# Patient Record
Sex: Male | Born: 1980 | Race: Black or African American | Hispanic: No | Marital: Married | Smoking: Current every day smoker
Health system: Southern US, Community
[De-identification: ages and names within clinical notes are randomized; demographics above are authoritative.]

## PROBLEM LIST (undated history)

## (undated) DIAGNOSIS — F419 Anxiety disorder, unspecified: Secondary | ICD-10-CM

---

## 2006-12-06 ENCOUNTER — Emergency Department (HOSPITAL_COMMUNITY): Admission: EM | Admit: 2006-12-06 | Discharge: 2006-12-06 | Payer: Self-pay | Admitting: Family Medicine

## 2007-11-08 ENCOUNTER — Emergency Department (HOSPITAL_COMMUNITY): Admission: EM | Admit: 2007-11-08 | Discharge: 2007-11-08 | Payer: Self-pay | Admitting: Family Medicine

## 2010-05-06 ENCOUNTER — Emergency Department (HOSPITAL_COMMUNITY): Admission: EM | Admit: 2010-05-06 | Discharge: 2010-05-06 | Payer: Self-pay | Admitting: Emergency Medicine

## 2010-11-22 LAB — DIFFERENTIAL
Eosinophils Absolute: 0.1 10*3/uL (ref 0.0–0.7)
Eosinophils Relative: 3 % (ref 0–5)
Lymphocytes Relative: 30 % (ref 12–46)
Lymphs Abs: 1.4 10*3/uL (ref 0.7–4.0)

## 2010-11-22 LAB — BASIC METABOLIC PANEL
BUN: 6 mg/dL (ref 6–23)
CO2: 25 mEq/L (ref 19–32)
Calcium: 9.4 mg/dL (ref 8.4–10.5)
Chloride: 102 mEq/L (ref 96–112)
Creatinine, Ser: 0.87 mg/dL (ref 0.4–1.5)
GFR calc Af Amer: >60 mL/min (ref >60)
GFR calc non Af Amer: >60 mL/min (ref >60)
Glucose, Bld: 112 mg/dL — ABNORMAL HIGH (ref 70–99)
Potassium: 3 mEq/L — ABNORMAL LOW (ref 3.5–5.1)
Sodium: 138 mEq/L (ref 135–145)

## 2010-11-22 LAB — RAPID URINE DRUG SCREEN, HOSP PERFORMED
Amphetamines: NOT DETECTED
Barbiturates: NOT DETECTED
Benzodiazepines: NOT DETECTED
Cocaine: NOT DETECTED
Opiates: NOT DETECTED
Tetrahydrocannabinol: NOT DETECTED

## 2010-11-22 LAB — CBC
HCT: 46.6 % (ref 39.0–52.0)
Hemoglobin: 16.1 g/dL (ref 13.0–17.0)
MCH: 31.1 pg (ref 26.0–34.0)
MCHC: 34.7 g/dL (ref 30.0–36.0)
Platelets: 172 10*3/uL (ref 150–400)

## 2010-11-22 LAB — URINALYSIS, ROUTINE W REFLEX MICROSCOPIC
Glucose, UA: NEGATIVE mg/dL
Hgb urine dipstick: NEGATIVE
Specific Gravity, Urine: 1.021 (ref 1.005–1.030)
pH: 5.5 (ref 5.0–8.0)

## 2010-11-22 LAB — POCT CARDIAC MARKERS: Myoglobin, poc: >500 ng/mL (ref 12–200)

## 2010-11-22 LAB — ETHANOL: Alcohol, Ethyl (B): <5 mg/dL (ref 0–10)

## 2017-10-08 ENCOUNTER — Ambulatory Visit (HOSPITAL_COMMUNITY): Payer: Self-pay

## 2017-10-26 ENCOUNTER — Ambulatory Visit (HOSPITAL_COMMUNITY): Payer: Self-pay

## 2017-10-28 ENCOUNTER — Ambulatory Visit (HOSPITAL_COMMUNITY): Payer: Self-pay

## 2017-10-30 ENCOUNTER — Ambulatory Visit (HOSPITAL_COMMUNITY): Payer: Self-pay

## 2017-11-02 ENCOUNTER — Ambulatory Visit (HOSPITAL_COMMUNITY): Payer: Self-pay

## 2017-11-04 ENCOUNTER — Ambulatory Visit (HOSPITAL_COMMUNITY): Payer: Self-pay

## 2017-11-06 ENCOUNTER — Ambulatory Visit (HOSPITAL_COMMUNITY): Payer: Self-pay

## 2017-11-09 ENCOUNTER — Ambulatory Visit (HOSPITAL_COMMUNITY): Payer: Self-pay

## 2017-11-11 ENCOUNTER — Ambulatory Visit (HOSPITAL_COMMUNITY): Payer: Self-pay

## 2017-11-13 ENCOUNTER — Ambulatory Visit (HOSPITAL_COMMUNITY): Payer: Self-pay

## 2017-11-16 ENCOUNTER — Ambulatory Visit (HOSPITAL_COMMUNITY): Payer: Self-pay

## 2017-11-18 ENCOUNTER — Ambulatory Visit (HOSPITAL_COMMUNITY): Payer: Self-pay

## 2017-11-20 ENCOUNTER — Ambulatory Visit (HOSPITAL_COMMUNITY): Payer: Self-pay

## 2017-11-23 ENCOUNTER — Ambulatory Visit (HOSPITAL_COMMUNITY): Payer: Self-pay

## 2017-11-25 ENCOUNTER — Ambulatory Visit (HOSPITAL_COMMUNITY): Payer: Self-pay

## 2017-11-27 ENCOUNTER — Ambulatory Visit (HOSPITAL_COMMUNITY): Payer: Self-pay

## 2017-11-30 ENCOUNTER — Ambulatory Visit (HOSPITAL_COMMUNITY): Payer: Self-pay

## 2017-12-02 ENCOUNTER — Ambulatory Visit (HOSPITAL_COMMUNITY): Payer: Self-pay

## 2017-12-04 ENCOUNTER — Ambulatory Visit (HOSPITAL_COMMUNITY): Payer: Self-pay

## 2017-12-07 ENCOUNTER — Ambulatory Visit (HOSPITAL_COMMUNITY): Payer: Self-pay

## 2017-12-09 ENCOUNTER — Ambulatory Visit (HOSPITAL_COMMUNITY): Payer: Self-pay

## 2017-12-11 ENCOUNTER — Ambulatory Visit (HOSPITAL_COMMUNITY): Payer: Self-pay

## 2017-12-14 ENCOUNTER — Ambulatory Visit (HOSPITAL_COMMUNITY): Payer: Self-pay

## 2017-12-16 ENCOUNTER — Ambulatory Visit (HOSPITAL_COMMUNITY): Payer: Self-pay

## 2017-12-18 ENCOUNTER — Ambulatory Visit (HOSPITAL_COMMUNITY): Payer: Self-pay

## 2017-12-21 ENCOUNTER — Ambulatory Visit (HOSPITAL_COMMUNITY): Payer: Self-pay

## 2017-12-23 ENCOUNTER — Emergency Department (HOSPITAL_COMMUNITY): Payer: Self-pay

## 2017-12-23 ENCOUNTER — Ambulatory Visit (HOSPITAL_COMMUNITY): Payer: Self-pay

## 2017-12-23 ENCOUNTER — Emergency Department (HOSPITAL_COMMUNITY)
Admission: EM | Admit: 2017-12-23 | Discharge: 2017-12-23 | Disposition: A | Discharge status: Home / Self Care | Payer: Self-pay | Attending: Emergency Medicine | Admitting: Emergency Medicine

## 2017-12-23 ENCOUNTER — Encounter (HOSPITAL_COMMUNITY): Payer: Self-pay | Admitting: *Deleted

## 2017-12-23 ENCOUNTER — Other Ambulatory Visit: Payer: Self-pay

## 2017-12-23 DIAGNOSIS — L089 Local infection of the skin and subcutaneous tissue, unspecified: Secondary | ICD-10-CM

## 2017-12-23 DIAGNOSIS — F17200 Nicotine dependence, unspecified, uncomplicated: Secondary | ICD-10-CM | POA: Insufficient documentation

## 2017-12-23 DIAGNOSIS — L02611 Cutaneous abscess of right foot: Secondary | ICD-10-CM | POA: Insufficient documentation

## 2017-12-23 LAB — BASIC METABOLIC PANEL
Anion gap: 9 (ref 5–15)
BUN: 13 mg/dL (ref 6–20)
CALCIUM: 9.1 mg/dL (ref 8.9–10.3)
CHLORIDE: 104 mmol/L (ref 101–111)
CO2: 26 mmol/L (ref 22–32)
CREATININE: 0.79 mg/dL (ref 0.61–1.24)
GFR calc non Af Amer: >60 mL/min (ref >60)
GLUCOSE: 98 mg/dL (ref 65–99)
Potassium: 4.1 mmol/L (ref 3.5–5.1)
Sodium: 139 mmol/L (ref 135–145)

## 2017-12-23 LAB — CBC
HCT: 46.2 % (ref 39.0–52.0)
Hemoglobin: 16.1 g/dL (ref 13.0–17.0)
MCH: 30.6 pg (ref 26.0–34.0)
MCHC: 34.8 g/dL (ref 30.0–36.0)
MCV: 87.7 fL (ref 78.0–100.0)
PLATELETS: 193 10*3/uL (ref 150–400)
RBC: 5.27 MIL/uL (ref 4.22–5.81)
RDW: 13.3 % (ref 11.5–15.5)
WBC: 4.4 10*3/uL (ref 4.0–10.5)

## 2017-12-23 MED ORDER — CIPROFLOXACIN IN D5W 400 MG/200ML IV SOLN
400.0000 mg | Freq: Once | INTRAVENOUS | Status: AC
  Administered 2017-12-23: 400 mg via INTRAVENOUS
  Filled 2017-12-23: qty 200

## 2017-12-23 MED ORDER — DOXYCYCLINE HYCLATE 100 MG PO CAPS: 100.0000 mg | ORAL_CAPSULE | Freq: Two times a day (BID) | ORAL | 0 refills | Status: DC

## 2017-12-23 MED ORDER — CIPROFLOXACIN HCL 500 MG PO TABS: 500.0000 mg | ORAL_TABLET | Freq: Two times a day (BID) | ORAL | 0 refills | Status: AC

## 2017-12-23 MED ORDER — VANCOMYCIN HCL 10 G IV SOLR
2000.0000 mg | INTRAVENOUS | Status: AC
  Administered 2017-12-23: 2000 mg via INTRAVENOUS
  Filled 2017-12-23: qty 2000

## 2017-12-23 MED ORDER — GADOBENATE DIMEGLUMINE 529 MG/ML IV SOLN
20.0000 mL | Freq: Once | INTRAVENOUS | Status: AC | PRN
  Administered 2017-12-23: 20 mL via INTRAVENOUS

## 2017-12-23 NOTE — ED Notes (Signed)
Patient transported to MRI 

## 2017-12-23 NOTE — ED Notes (Signed)
ED Provider at bedside. 

## 2017-12-23 NOTE — Progress Notes (Signed)
A consult was received from an ED physician for Cipro & Vancomycin per pharmacy dosing for wound infection.  The patient's profile has been reviewed for ht/wt/allergies/indication/available labs.   A one time order has been placed for Cipro 400mg  IV and Vancomycin 2gm IV.  Further antibiotics/pharmacy consults should be ordered by admitting physician if indicated.                       Thank you, Maryellen PilePoindexter, Tanzie Rothschild Trefz, PharmD 12/23/2017  12:12 PM

## 2017-12-23 NOTE — ED Provider Notes (Signed)
[facility]-EMERGENCY DEPT Provider Note   CSN: [id number] Arrival date & time: 12/23/17  0947     History   Chief Complaint Chief Complaint  Patient presents with  . Generalized Body Aches  . Leg Pain    Rt  . Foot Pain    Rt    HPI Jonathan Ortiz is a 37 y.o. male presenting for evaluation of right foot pain, generalized body aches, and right thigh pain.  Pt states that 2 days ago, he developed all of his symptoms at once.  He had generalized body aches and fevers.  He noticed a bump on the bottom of his right foot, which has continued to be tender.  He is got pain of the anterior right thigh, near his groin.  He denies fall, trauma, or injury.  He denies sick contacts.  He has been taking naproxen with improvement of his symptoms.  He is afebrile today.  He has no other medical problems, does not take medications daily. He denies stepping on anything. No h/o DM or neuropathy.   HPI  History reviewed. No pertinent past medical history.  There are no active problems to display for this patient.   History reviewed. No pertinent surgical history.      Home Medications    Prior to Admission medications   Medication Sig Start Date End Date Taking? Authorizing Provider  Multiple Vitamin (MULTIVITAMIN WITH MINERALS) TABS tablet Take 2 tablets by mouth daily.   Yes [provider]  naproxen (NAPROSYN) 500 MG tablet Take 500 mg by mouth 2 (two) times daily as needed for mild pain.   Yes [provider]  ciprofloxacin (CIPRO) 500 MG tablet Take 1 tablet (500 mg total) by mouth every 12 (twelve) hours for 5 days. 12/23/17 12/28/17  Erlean Mealor, PA-C  doxycycline (VIBRAMYCIN) 100 MG capsule Take 1 capsule (100 mg total) by mouth 2 (two) times daily. 12/23/17   Eivan Gallina, PA-C    Family History No family history on file.  Social History Social History   Tobacco Use  . Smoking status: Current Every Day Smoker  . Smokeless  tobacco: Never Used  Substance Use Topics  . Alcohol use: Never    Frequency: Never  . Drug use: Never     Allergies   Gadolinium derivatives   Review of Systems Review of Systems  Constitutional: Positive for fever.  Musculoskeletal: Positive for arthralgias and myalgias.       R foot pain, R thigh/groin pain  All other systems reviewed and are negative.    Physical Exam Updated Vital Signs BP 126/88   Pulse 75   Temp 98.4 F (36.9 C) (Oral)   Resp 16   Ht 6\' 3"  (1.905 m)   Wt 103 kg (227 lb)   SpO2 99%   BMI 28.37 kg/m   Physical Exam  Constitutional: He is oriented to person, place, and time. He appears well-developed and well-nourished. No distress.  HENT:  Head: Normocephalic and atraumatic.  Right Ear: Tympanic membrane, external ear and ear canal normal.  Left Ear: Tympanic membrane, external ear and ear canal normal.  Nose: Nose normal.  Mouth/Throat: Uvula is midline, oropharynx is clear and moist and mucous membranes are normal.  Eyes: Pupils are equal, round, and reactive to light. Conjunctivae and EOM are normal.  Neck: Normal range of motion.  Cardiovascular: Normal rate, regular rhythm and intact distal pulses.  Pulmonary/Chest: Effort normal and breath sounds normal. No respiratory distress. He has no  wheezes.  Abdominal: Soft. He exhibits no distension and no mass. There is no tenderness. There is no guarding.  Musculoskeletal: Normal range of motion. He exhibits tenderness.  TTP of anterior R pelvis. No palpable LAD. No rash, deformity, or erythema.  Fluid filled blister on the plantar R foot. No active drainage. Streaking noted on the medial R foot.   Neurological: He is alert and oriented to person, place, and time.  Skin: Skin is warm. No rash noted.  Psychiatric: He has a normal mood and affect.  Nursing note and vitals reviewed.    ED Treatments / Results  Labs (all labs ordered are listed, but only abnormal results are displayed) Labs  Reviewed  AEROBIC CULTURE (SUPERFICIAL SPECIMEN)  CBC  BASIC METABOLIC PANEL    EKG None  Radiology Mr Foot Right W Wo Contrast  Result Date: 12/23/2017 CLINICAL DATA:  The patient reports a lump on the plantar surface of the right foot for 2 days. EXAM: MRI OF THE RIGHT FOREFOOT WITHOUT AND WITH CONTRAST TECHNIQUE: Multiplanar, multisequence MR imaging of the right forefoot was performed before and after the administration of intravenous contrast. CONTRAST:  20 ml MULTIHANCE GADOBENATE DIMEGLUMINE 529 MG/ML IV SOLN COMPARISON:  Plain films right foot 12/23/2017. FINDINGS: Bones/Joint/Cartilage Normal marrow signal throughout without fracture, stress change or focal lesion. No evidence of arthropathy or infectious or inflammatory process. Ligaments Intact and normal in appearance. Muscles and Tendons Intact and normal in appearance. Soft tissues There is ill-defined subcutaneous edema and postcontrast enhancement on the plantar surface of the foot about the plantar fascia which is most notable in the subcutaneous fatty tissues. A small focal tear in the plantar fascia is seen at the level of the proximal metaphysis of the second metatarsal as demonstrated on images 22-24 of series 5. No mass or focal fluid collection is identified. IMPRESSION: Small focal rupture in the plantar fascia at the level the proximal metaphysis of the second metatarsal with surrounding edema and enhancement consistent with inflammatory change. The examination is otherwise negative. Electronically Signed   By: Drusilla Kannerhomas  Dalessio M.D.   On: 12/23/2017 15:41   Dg Foot Complete Right  Result Date: 12/23/2017 CLINICAL DATA:  Pain of the right foot.  Lump. EXAM: RIGHT FOOT COMPLETE - 3+ VIEW COMPARISON:  None. FINDINGS: Mild hallux valgus deformity of the great toe. No evidence of fracture. Suspicion of talocalcaneal coalition. Anterior talar beak. IMPRESSION: No acute finding. Mild hallux valgus deformity of the great toe.  Suspicion of tarsal coalition. Electronically Signed   By: Paulina FusiMark  Shogry M.D.   On: 12/23/2017 11:47    Procedures Procedures (including critical care time)  Medications Ordered in ED Medications  ciprofloxacin (CIPRO) IVPB 400 mg (0 mg Intravenous Stopped 12/23/17 1441)  vancomycin (VANCOCIN) 2,000 mg in sodium chloride 0.9 % 500 mL IVPB (0 mg Intravenous Paused 12/23/17 1458)  gadobenate dimeglumine (MULTIHANCE) injection 20 mL (20 mLs Intravenous Contrast Given 12/23/17 1512)     Initial Impression / Assessment and Plan / ED Course  I have reviewed the triage vital signs and the nursing notes.  Pertinent labs & imaging results that were available during my care of the patient were reviewed by me and considered in my medical decision making (see chart for details).     Patient presenting for evaluation of generalized body aches, right foot pain, right anterior thigh pain.  Physical exam shows patient is afebrile not tachycardic.  He appears nontoxic.  With attending, Dr. Patria Maneampos evaluated patient.  On  exam, blister started draining purulent material.  Concerned due to possible infection with streaking of the foot.  Left anterior thigh pain likely due to lymphadenopathy.  Obtain x-ray to ensure no ostium myelitis, subcu gas, fracture, or obvious foreign body.  X-ray reviewed and interpreted by me, negative for the above.  Basic labs reassuring, no leukocytosis.  Will obtain MRI of foot to rule out deep space infection, start IV antibiotics.  MRI shows superficial infection without deep space infection.  Discussed findings with patient.  Discussed discharge with antibiotics and follow-up as needed.  At this time, patient appears safe for discharge.  Return precautions given.  Patient states he understands and agrees to plan.   Final Clinical Impressions(s) / ED Diagnoses   Final diagnoses:  Foot infection    ED Discharge Orders        Ordered    doxycycline (VIBRAMYCIN) 100 MG capsule   2 times daily     12/23/17 1557    ciprofloxacin (CIPRO) 500 MG tablet  Every 12 hours     12/23/17 1557       Alveria Apley, PA-C 12/23/17 1559    Azalia Bilis, MD 12/23/17 1654

## 2017-12-23 NOTE — ED Triage Notes (Signed)
Pt states for 2 days, "feeling bad and body aches" " rt foot lump and pin in upper rt thigh" "I wasn't gona come but wife and mom told me to" states he feels better today

## 2017-12-23 NOTE — ED Notes (Signed)
Patient transported to X-ray 

## 2017-12-23 NOTE — Discharge Instructions (Addendum)
Take antibiotics as prescribed.  Take the entire course, even if your symptoms improve. Continue to use Tylenol or ibuprofen as needed for pain. Return to the emergency room if you develop fevers, worsening redness coming up your leg, worsening infection, or any new or concerning symptoms.

## 2017-12-25 ENCOUNTER — Ambulatory Visit (HOSPITAL_COMMUNITY): Payer: Self-pay

## 2017-12-25 LAB — AEROBIC CULTURE  (SUPERFICIAL SPECIMEN): SPECIAL REQUESTS: NORMAL

## 2017-12-25 LAB — AEROBIC CULTURE W GRAM STAIN (SUPERFICIAL SPECIMEN)

## 2017-12-28 ENCOUNTER — Ambulatory Visit (HOSPITAL_COMMUNITY): Payer: Self-pay

## 2017-12-30 ENCOUNTER — Ambulatory Visit (HOSPITAL_COMMUNITY): Payer: Self-pay

## 2018-01-01 ENCOUNTER — Ambulatory Visit (HOSPITAL_COMMUNITY): Payer: Self-pay

## 2018-01-04 ENCOUNTER — Ambulatory Visit (HOSPITAL_COMMUNITY): Payer: Self-pay

## 2018-01-06 ENCOUNTER — Ambulatory Visit (HOSPITAL_COMMUNITY): Payer: Self-pay

## 2018-01-08 ENCOUNTER — Ambulatory Visit (HOSPITAL_COMMUNITY): Payer: Self-pay

## 2018-01-11 ENCOUNTER — Ambulatory Visit (HOSPITAL_COMMUNITY): Payer: Self-pay

## 2018-01-13 ENCOUNTER — Ambulatory Visit (HOSPITAL_COMMUNITY): Payer: Self-pay

## 2018-01-15 ENCOUNTER — Ambulatory Visit (HOSPITAL_COMMUNITY): Payer: Self-pay

## 2018-01-18 ENCOUNTER — Ambulatory Visit (HOSPITAL_COMMUNITY): Payer: Self-pay

## 2018-01-20 ENCOUNTER — Ambulatory Visit (HOSPITAL_COMMUNITY): Payer: Self-pay

## 2018-01-22 ENCOUNTER — Ambulatory Visit (HOSPITAL_COMMUNITY): Payer: Self-pay

## 2018-01-25 ENCOUNTER — Ambulatory Visit (HOSPITAL_COMMUNITY): Payer: Self-pay

## 2018-01-27 ENCOUNTER — Ambulatory Visit (HOSPITAL_COMMUNITY): Payer: Self-pay

## 2018-02-18 ENCOUNTER — Encounter (HOSPITAL_COMMUNITY): Payer: Self-pay | Admitting: Emergency Medicine

## 2018-02-18 ENCOUNTER — Emergency Department (HOSPITAL_COMMUNITY): Payer: Self-pay

## 2018-02-18 ENCOUNTER — Other Ambulatory Visit: Payer: Self-pay

## 2018-02-18 ENCOUNTER — Emergency Department (HOSPITAL_COMMUNITY)
Admission: EM | Admit: 2018-02-18 | Discharge: 2018-02-18 | Discharge status: Against Medical Advice | Payer: Self-pay | Attending: Emergency Medicine | Admitting: Emergency Medicine

## 2018-02-18 DIAGNOSIS — Z79899 Other long term (current) drug therapy: Secondary | ICD-10-CM | POA: Insufficient documentation

## 2018-02-18 DIAGNOSIS — F17210 Nicotine dependence, cigarettes, uncomplicated: Secondary | ICD-10-CM | POA: Insufficient documentation

## 2018-02-18 DIAGNOSIS — R9431 Abnormal electrocardiogram [ECG] [EKG]: Secondary | ICD-10-CM | POA: Insufficient documentation

## 2018-02-18 DIAGNOSIS — R079 Chest pain, unspecified: Secondary | ICD-10-CM | POA: Insufficient documentation

## 2018-02-18 HISTORY — DX: Anxiety disorder, unspecified: F41.9

## 2018-02-18 LAB — CBC
HCT: 47.6 % (ref 39.0–52.0)
Hemoglobin: 15.9 g/dL (ref 13.0–17.0)
MCH: 29.6 pg (ref 26.0–34.0)
MCHC: 33.4 g/dL (ref 30.0–36.0)
MCV: 88.6 fL (ref 78.0–100.0)
PLATELETS: 192 10*3/uL (ref 150–400)
RBC: 5.37 MIL/uL (ref 4.22–5.81)
RDW: 13.6 % (ref 11.5–15.5)
WBC: 3.6 10*3/uL — AB (ref 4.0–10.5)

## 2018-02-18 LAB — BASIC METABOLIC PANEL
Anion gap: 10 (ref 5–15)
BUN: 8 mg/dL (ref 6–20)
CHLORIDE: 104 mmol/L (ref 101–111)
CO2: 25 mmol/L (ref 22–32)
CREATININE: 0.88 mg/dL (ref 0.61–1.24)
Calcium: 9.2 mg/dL (ref 8.9–10.3)
GFR calc Af Amer: >60 mL/min (ref >60)
GFR calc non Af Amer: >60 mL/min (ref >60)
GLUCOSE: 113 mg/dL — AB (ref 65–99)
Potassium: 3.5 mmol/L (ref 3.5–5.1)
SODIUM: 139 mmol/L (ref 135–145)

## 2018-02-18 LAB — I-STAT TROPONIN, ED: Troponin i, poc: 0 ng/mL (ref 0.00–0.08)

## 2018-02-18 NOTE — ED Triage Notes (Signed)
Pt arrives via EMS for 30 minutes of left sided chest pain that started during video games. Also reports dizziness. Evaluated by the PA at triage.

## 2018-02-18 NOTE — ED Notes (Signed)
Patient in room. Refusing to change into gown.

## 2018-02-18 NOTE — ED Provider Notes (Addendum)
Patient placed in Quick Look pathway, seen and evaluated   Chief Complaint: chest pain, shortness of breath  HPI:   Patient presents today for evaluation of sudden onset, constant left-sided chest pain and shortness of breath. Pain began approximately 30 minutes prior to arrival while he was playing a video game. Pain is localized to the left lower chest, radiates to the right lower chest. Chest pain worsens with deep inspiration and bending. Endorses some nausea.No recent trauma or falls. He is a current smoker. He did have 4 alcoholic beverages yesterday and smoked some CBD but denies any other recreational drug use. No recent travel or surgeries, no lower extremity edema, no hemoptysis, no prior history of DVT or PE, and he is not on testosterone hormone replacement therapy. Denies fevers, chills, vomiting, or diaphoresis.  ROS: positive for chest pain, shortness of breath, nausea negative for fevers, chills, syncope, abdominal pain, vomiting  Physical Exam:   Gen: No distress  Neuro: Awake and Alert  Skin: Warm    Focused Exam:heart regular rate and rhythm, no murmurs rubs or gallops. Lungs clear to auscultation bilaterally.speaking in full sentences without difficulty.  Palpation of the right lower anterior chest wall causes pain in the left lower anterior chest wall. No deformity, crepitus, ecchymosis, or for segment noted. 2+ DP/PT pulses and 2+ radial pulses bilaterally. No LE edema.   Initiation of care has begun. The patient has been counseled on the process, plan, and necessity for staying for the completion/evaluation, and the remainder of the medical screening examination      Bennye AlmFawze, Tattiana Fakhouri A, PA-C 02/18/18 1706    Margarita Grizzleay, Danielle, MD 02/18/18 2123

## 2018-02-18 NOTE — ED Notes (Signed)
Patient arrived to room, refused to change into gown and stated that he wanted to leave. EDP was notified. Dr. Rosalia Hammersay came in to discuss with patient need to collect troponin to rule cardiac concern. Patient came out to desk requesting that IV be taken out. Patient left against medical advice.

## 2018-02-18 NOTE — ED Notes (Signed)
Delay in lab draw,  Pt not in room 

## 2018-02-18 NOTE — ED Provider Notes (Signed)
[facility] EMERGENCY DEPARTMENT Provider Note   CSN: [id number] Arrival date & time: 02/18/18  1649     History   Chief Complaint Chief Complaint  Patient presents with  . Chest Pain    HPI Jonathan Ortiz is a 37 y.o. male.  HPI  37 year old male presents today complaining of left lower chest pain sudden onset that began while playing video games earlier today.  He states that lasted approximately half an hour.  It resolved in route to the hospital via EMS after receiving aspirin.  He states it feels similar to pain he had previously with anxiety.  Past Medical History:  Diagnosis Date  . Anxiety     There are no active problems to display for this patient.   No past surgical history on file.      Home Medications    Prior to Admission medications   Medication Sig Start Date End Date Taking? Authorizing Provider  doxycycline (VIBRAMYCIN) 100 MG capsule Take 1 capsule (100 mg total) by mouth 2 (two) times daily. 12/23/17   Caccavale, Sophia, PA-C  Multiple Vitamin (MULTIVITAMIN WITH MINERALS) TABS tablet Take 2 tablets by mouth daily.    [provider]  naproxen (NAPROSYN) 500 MG tablet Take 500 mg by mouth 2 (two) times daily as needed for mild pain.    [provider]    Family History No family history on file.  Social History Social History   Tobacco Use  . Smoking status: Current Every Day Smoker  . Smokeless tobacco: Never Used  Substance Use Topics  . Alcohol use: Never    Frequency: Never  . Drug use: Never     Allergies   Gadolinium derivatives   Review of Systems Review of Systems  All other systems reviewed and are negative.    Physical Exam Updated Vital Signs BP 114/89   Pulse 68   Temp 98.6 F (37 C)   Resp 16   SpO2 100%   Physical Exam  Constitutional: He is oriented to person, place, and time. He appears well-developed and well-nourished.  Obese  HENT:  Head: Normocephalic and  atraumatic.  Right Ear: External ear normal.  Left Ear: External ear normal.  Nose: Nose normal.  Mouth/Throat: Oropharynx is clear and moist.  Eyes: Pupils are equal, round, and reactive to light. Conjunctivae and EOM are normal.  Neck: Normal range of motion. Neck supple.  Cardiovascular: Normal rate, regular rhythm, normal heart sounds and intact distal pulses.  Pulmonary/Chest: Effort normal and breath sounds normal. No respiratory distress. He has no wheezes. He exhibits no tenderness.  Abdominal: Soft. Bowel sounds are normal. He exhibits no distension and no mass. There is no tenderness. There is no guarding.  Musculoskeletal: Normal range of motion.  Neurological: He is alert and oriented to person, place, and time. He has normal reflexes. He exhibits normal muscle tone. Coordination normal.  Skin: Skin is warm and dry.  Psychiatric: He has a normal mood and affect. His behavior is normal. Judgment and thought content normal.  Nursing note and vitals reviewed.    ED Treatments / Results  Labs (all labs ordered are listed, but only abnormal results are displayed) Labs Reviewed  BASIC METABOLIC PANEL - Abnormal; Notable for the following components:      Result Value   Glucose, Bld 113 (*)    All other components within normal limits  CBC - Abnormal; Notable for the following components:   WBC 3.6 (*)  All other components within normal limits  I-STAT TROPONIN, ED  I-STAT TROPONIN, ED  I-STAT TROPONIN, ED    EKG EKG Interpretation  Date/Time:  Thursday February 18 2018 16:52:56 EDT Ventricular Rate:  83 PR Interval:  166 QRS Duration: 86 QT Interval:  360 QTC Calculation: 423 R Axis:   77 Text Interpretation:  Normal sinus rhythm Poor R wave progression Non-specific ST-t changes Confirmed by Margarita Grizzleay, Ketra Duchesne [phone]([id number]) on 02/18/2018 8:58:00 PM   Radiology Dg Chest 2 View  Result Date: 02/18/2018 CLINICAL DATA:  Chest pain, shortness of breath, tightness EXAM: CHEST - 2  VIEW COMPARISON:  05/06/2010 FINDINGS: The heart size and mediastinal contours are within normal limits. Both lungs are clear. The visualized skeletal structures are unremarkable. IMPRESSION: No active cardiopulmonary disease. Electronically Signed   By: Elige KoHetal  Patel   On: 02/18/2018 17:29    Procedures Procedures (including critical care time)  Medications Ordered in ED Medications - No data to display   Initial Impression / Assessment and Plan / ED Course  I have reviewed the triage vital signs and the nursing notes.  Pertinent labs & imaging results that were available during my care of the patient were reviewed by me and considered in my medical decision making (see chart for details).     37 year old male presents today with left-sided chest pain.  EKG with poor R wave progression compared to prior.  Initial troponin negative.  Plan repeat troponin.  Heart score 2.  Discussed with patient.  Patient left after my evaluation without discussing with me.  Final Clinical Impressions(s) / ED Diagnoses   Final diagnoses:  Chest pain, unspecified type  Abnormal EKG    ED Discharge Orders    None       Margarita Grizzleay, Kolby Schara, MD 02/18/18 2122

## 2018-02-19 ENCOUNTER — Emergency Department (HOSPITAL_COMMUNITY)
Admission: EM | Admit: 2018-02-19 | Discharge: 2018-02-19 | Disposition: A | Discharge status: Home / Self Care | Payer: Self-pay | Attending: Emergency Medicine | Admitting: Emergency Medicine

## 2018-02-19 ENCOUNTER — Encounter (HOSPITAL_COMMUNITY): Payer: Self-pay | Admitting: *Deleted

## 2018-02-19 DIAGNOSIS — F419 Anxiety disorder, unspecified: Secondary | ICD-10-CM | POA: Insufficient documentation

## 2018-02-19 DIAGNOSIS — R079 Chest pain, unspecified: Secondary | ICD-10-CM | POA: Insufficient documentation

## 2018-02-19 DIAGNOSIS — F17200 Nicotine dependence, unspecified, uncomplicated: Secondary | ICD-10-CM | POA: Insufficient documentation

## 2018-02-19 LAB — I-STAT TROPONIN, ED: TROPONIN I, POC: 0 ng/mL (ref 0.00–0.08)

## 2018-02-19 MED ORDER — LORAZEPAM 1 MG PO TABS: 1.0000 mg | ORAL_TABLET | Freq: Four times a day (QID) | ORAL | 0 refills | Status: DC | PRN

## 2018-02-19 NOTE — ED Provider Notes (Signed)
[facility] EMERGENCY DEPARTMENT Provider Note   CSN: [id number] Arrival date & time: 02/19/18  0031     History   Chief Complaint Chief Complaint  Patient presents with  . Chest Pain    HPI Rosser Collington is a 37 y.o. male.  Patient is a 37 year old male with no significant past medical history.  He presents today with complaints of chest discomfort.  This started earlier this evening while he was at home playing a video game.  He reports a tightness in the center of his chest with no radiation, nausea, diaphoresis, or shortness of breath.  He was seen here earlier for this complaint.  He had an EKG and laboratory studies obtained which were negative.  He was advised to stay for a second troponin, however left AGAINST MEDICAL ADVICE.  His pain has persisted once arriving home and returns to have a second troponin performed.  The history is provided by the patient.  Chest Pain   This is a new problem. The current episode started 6 to 12 hours ago. The problem occurs constantly. The problem has been resolved. The pain is present in the substernal region. The pain is moderate. The quality of the pain is described as pressure-like. The pain does not radiate. Pertinent negatives include no cough, no diaphoresis, no numbness, no palpitations and no shortness of breath. He has tried nothing for the symptoms. The treatment provided no relief.    Past Medical History:  Diagnosis Date  . Anxiety     There are no active problems to display for this patient.   History reviewed. No pertinent surgical history.      Home Medications    Prior to Admission medications   Medication Sig Start Date End Date Taking? Authorizing Provider  simethicone (MYLICON) 80 MG chewable tablet Chew 80 mg by mouth every 6 (six) hours as needed for flatulence.   Yes [provider]  doxycycline (VIBRAMYCIN) 100 MG capsule Take 1 capsule (100 mg total) by mouth 2 (two) times  daily. Patient not taking: Reported on 02/19/2018 12/23/17   Caccavale, Jeanette Caprice, PA-C    Family History No family history on file.  Social History Social History   Tobacco Use  . Smoking status: Current Every Day Smoker  . Smokeless tobacco: Never Used  Substance Use Topics  . Alcohol use: Never    Frequency: Never  . Drug use: Never     Allergies   Gadolinium derivatives   Review of Systems Review of Systems  Constitutional: Negative for diaphoresis.  Respiratory: Negative for cough and shortness of breath.   Cardiovascular: Positive for chest pain. Negative for palpitations.  Neurological: Negative for numbness.  All other systems reviewed and are negative.    Physical Exam Updated Vital Signs BP 133/81   Pulse 69   Temp 98.3 F (36.8 C) (Oral)   Resp 18   SpO2 98%   Physical Exam  Constitutional: He is oriented to person, place, and time. He appears well-developed and well-nourished. No distress.  HENT:  Head: Normocephalic and atraumatic.  Mouth/Throat: Oropharynx is clear and moist.  Neck: Normal range of motion. Neck supple.  Cardiovascular: Normal rate and regular rhythm. Exam reveals no friction rub.  No murmur heard. Pulmonary/Chest: Effort normal and breath sounds normal. No respiratory distress. He has no wheezes. He has no rales.  Abdominal: Soft. Bowel sounds are normal. He exhibits no distension. There is no tenderness.  Musculoskeletal: Normal range of motion. He exhibits no  edema.       Right lower leg: Normal. He exhibits no tenderness and no edema.       Left lower leg: Normal. He exhibits no tenderness and no edema.  Neurological: He is alert and oriented to person, place, and time. Coordination normal.  Skin: Skin is warm and dry. He is not diaphoretic.  Nursing note and vitals reviewed.    ED Treatments / Results  Labs (all labs ordered are listed, but only abnormal results are displayed) Labs Reviewed  I-STAT TROPONIN, ED     EKG EKG Interpretation  Date/Time:  Friday February 19 2018 00:39:11 EDT Ventricular Rate:  77 PR Interval:  176 QRS Duration: 76 QT Interval:  362 QTC Calculation: 409 R Axis:   73 Text Interpretation:  Normal sinus rhythm Septal infarct , age undetermined Abnormal ECG When compared with ECG of 02/18/2018, No significant change was found Confirmed by Dione BoozeGlick, David ([id number]) on 02/19/2018 12:40:57 AM   Radiology Dg Chest 2 View  Result Date: 02/18/2018 CLINICAL DATA:  Chest pain, shortness of breath, tightness EXAM: CHEST - 2 VIEW COMPARISON:  05/06/2010 FINDINGS: The heart size and mediastinal contours are within normal limits. Both lungs are clear. The visualized skeletal structures are unremarkable. IMPRESSION: No active cardiopulmonary disease. Electronically Signed   By: Elige KoHetal  Patel   On: 02/18/2018 17:29    Procedures Procedures (including critical care time)  Medications Ordered in ED Medications - No data to display   Initial Impression / Assessment and Plan / ED Course  I have reviewed the triage vital signs and the nursing notes.  Pertinent labs & imaging results that were available during my care of the patient were reviewed by me and considered in my medical decision making (see chart for details).  Patient's troponin this evening is negative and his EKG is unchanged from prior study.  I highly doubt a cardiac etiology to his symptoms, and suspect more anxiety or costochondritis.  He has only risk factors of tobacco use and his symptoms are atypical.  He will be discharged with ibuprofen and a small quantity of lorazepam as he states he has had this in the past.  Final Clinical Impressions(s) / ED Diagnoses   Final diagnoses:  None    ED Discharge Orders    None       Geoffery Lyonselo, [place], MD 02/19/18 [phone]

## 2018-02-19 NOTE — Discharge Instructions (Signed)
Ibuprofen 600 mill grams every 6 hours as needed for pain.  Ativan as prescribed as needed for anxiety.  Follow-up with cardiology if your symptoms continue beyond the next 3 to 4 days, and return to the ER if symptoms significantly worsen or change.

## 2018-02-19 NOTE — ED Triage Notes (Signed)
Pt was here earlier tonight for chest discomfort, sob, and dizziness. Reports the MD earlier requested pt wait to have a repeat troponin but he was feeling better and decided to leave. Pt returns after having constant chest discomfort with belching to attempt to help the discomfort. Repeat troponin collected in troponin. Labs and chest xray are listed in chart review

## 2019-04-10 IMAGING — MR MR FOOT*R* WO/W CM
5 of 9 series · 19 of 40 positions shown · IV contrast (multihance)
Comparison: Plain films right foot 12/23/2017.

CLINICAL DATA: The patient reports a lump on the plantar surface of
the right foot for 2 days.

EXAM:
MRI OF THE RIGHT FOREFOOT WITHOUT AND WITH CONTRAST
TECHNIQUE: Multiplanar, multisequence MR imaging of the right forefoot was
performed before and after the administration of intravenous
contrast.
CONTRAST:  20 ml MULTIHANCE GADOBENATE DIMEGLUMINE 529 MG/ML IV SOLN

[Series 3: T1 · coronal · 4.0mm · 0.29mm/px · 5 of 40 slices shown (1 of 2)]
[im 1/40]
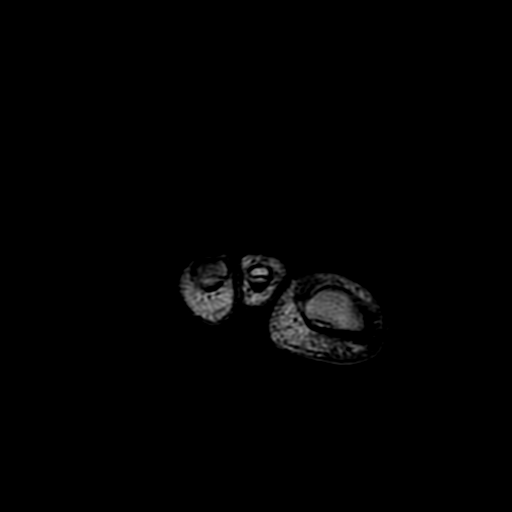
[im 10/40]
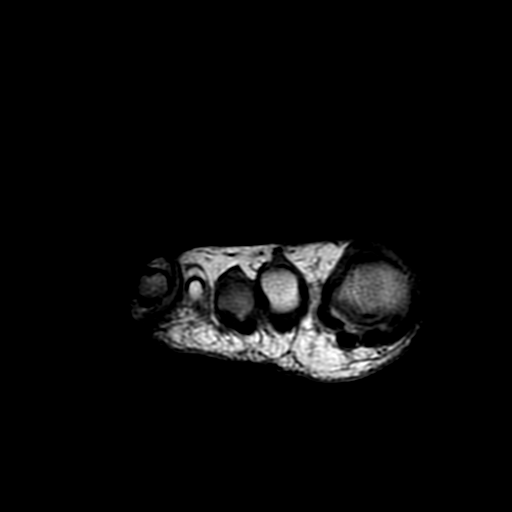
[im 20/40]
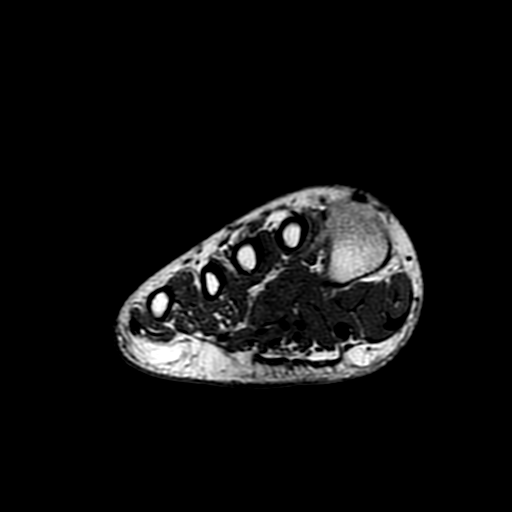
[im 30/40]
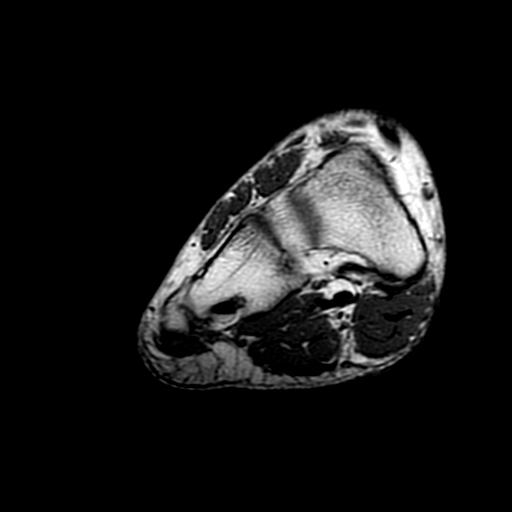
[im 40/40]
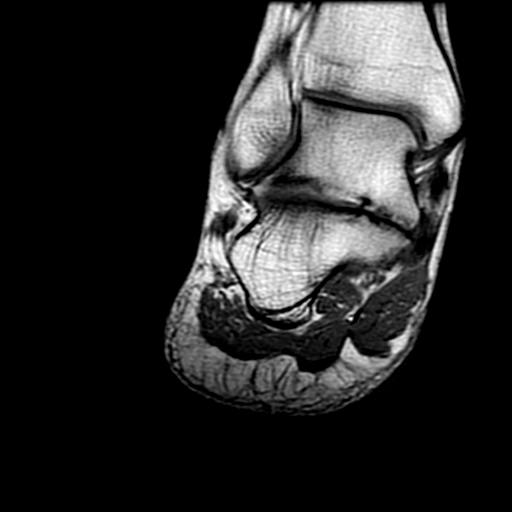

[Series 6: T1 · axial · 4.0mm · 0.35mm/px · z∈[-84,+6]mm · 3 of 20 slices shown (2 of 2)]
[im 1/20]
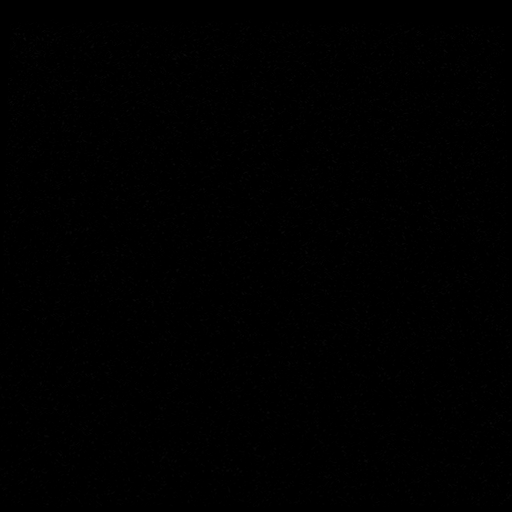
[im 10/20]
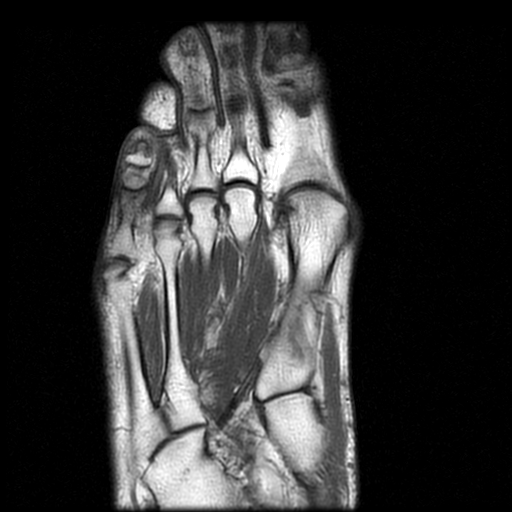
[im 20/20]
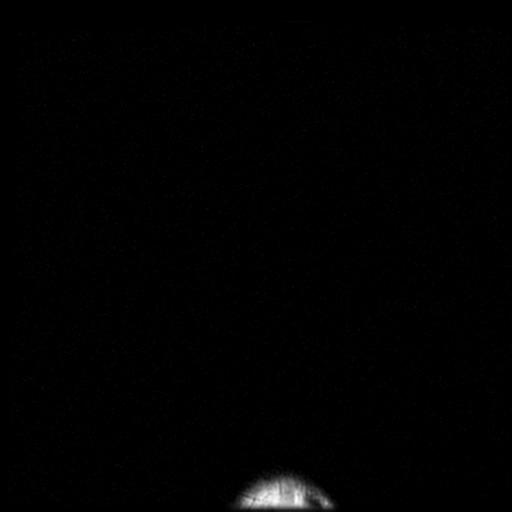

[Series 10: T1 fat-sat post-contrast · coronal · 4.0mm · 0.29mm/px · 6 of 40 slices shown]
[im 1/40]
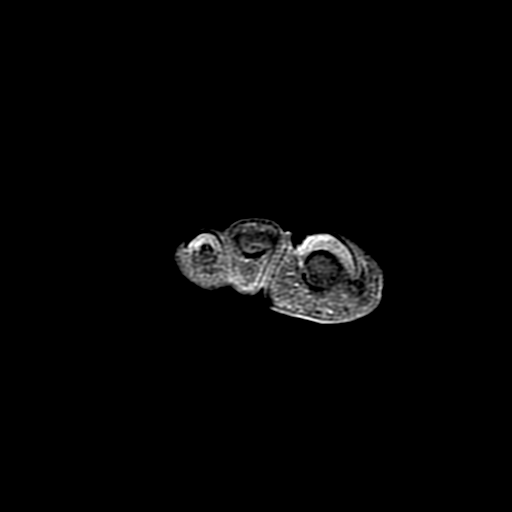
[im 8/40]
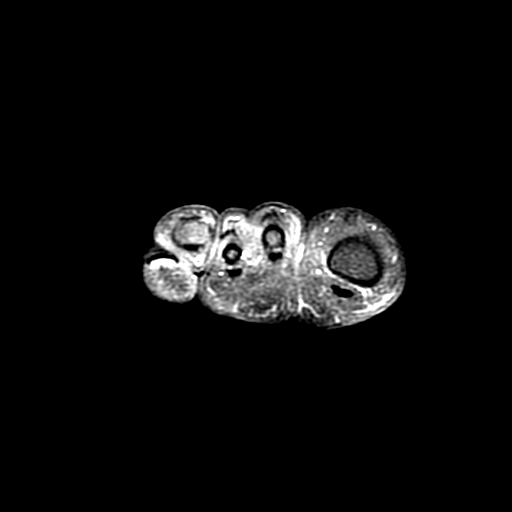
[im 16/40]
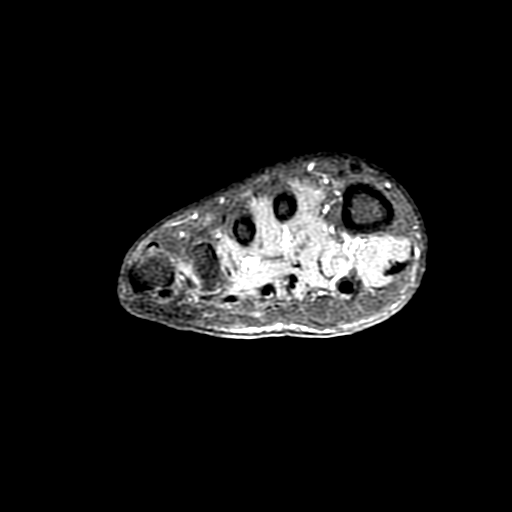
[im 24/40]
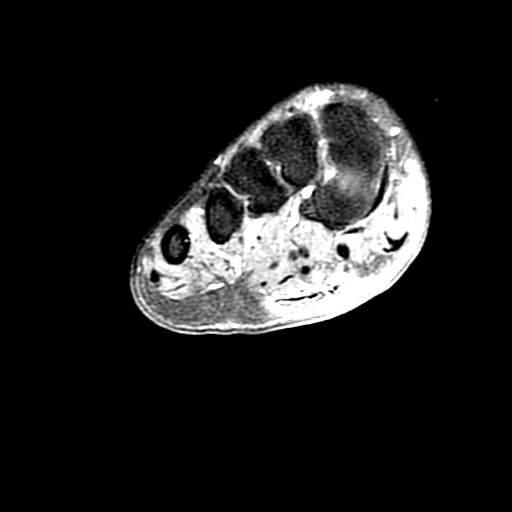
[im 32/40]
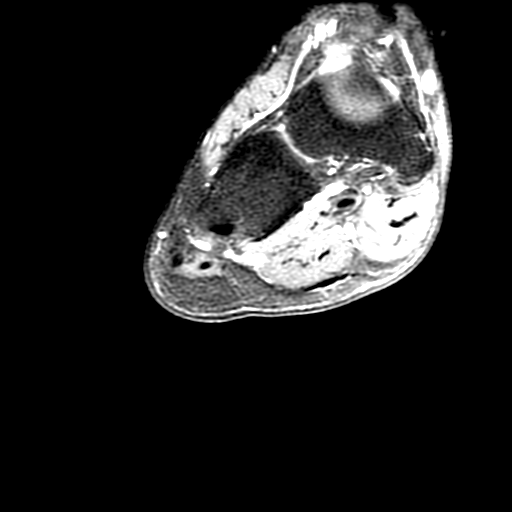
[im 40/40]
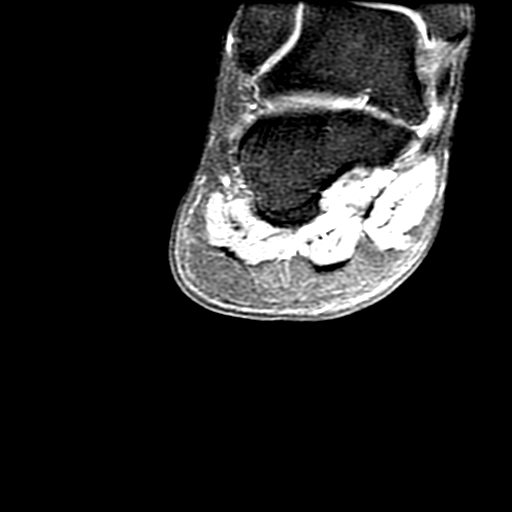

[Series 11: T1 post-contrast · axial · 4.0mm · 0.35mm/px · z∈[-84,+6]mm · 3 of 20 slices shown (1 of 2)]
[im 1/20]
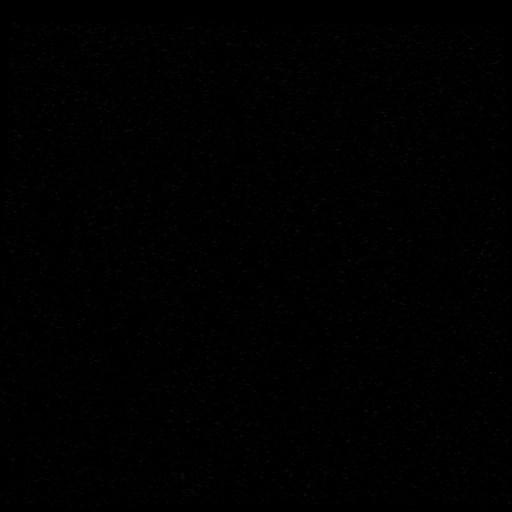
[im 10/20]
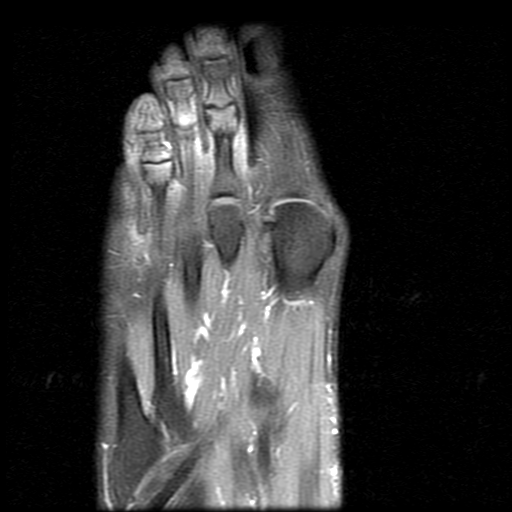
[im 20/20]
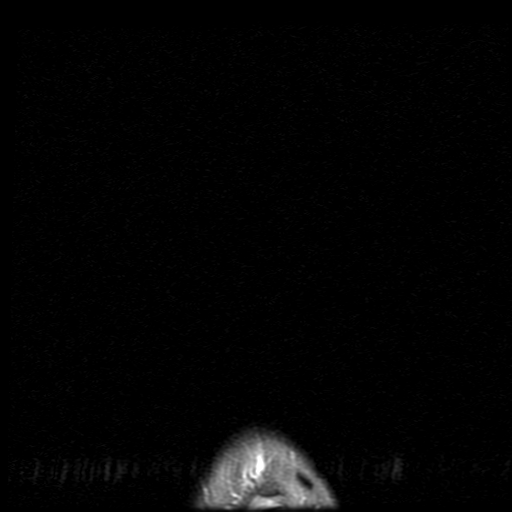

[Series 12: T1 post-contrast · sagittal · 4.0mm · 0.35mm/px · 2 of 24 slices shown (2 of 2)]
[im 1/24]
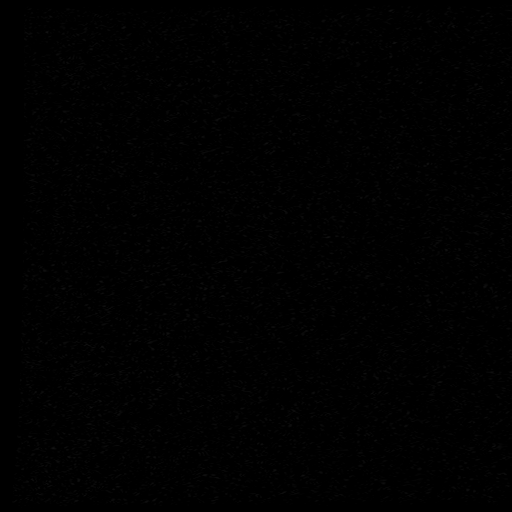
[im 8/24]
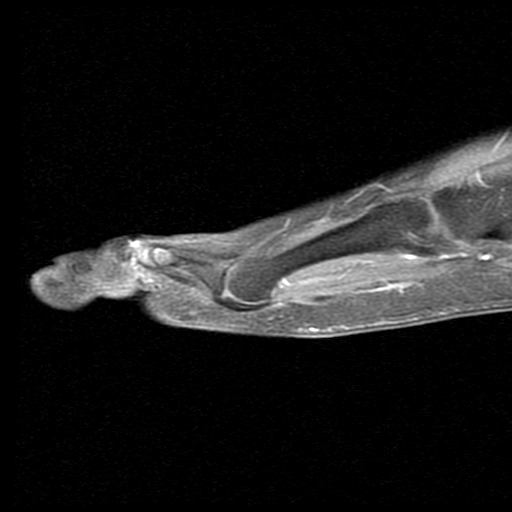

[19 of 40 positions shown; findings below may reference images not displayed]

FINDINGS: Bones/Joint/Cartilage

Normal marrow signal throughout without fracture, stress change or
focal lesion. No evidence of arthropathy or infectious or
inflammatory process.

Ligaments

Intact and normal in appearance.

Muscles and Tendons

Intact and normal in appearance.

Soft tissues

There is ill-defined subcutaneous edema and postcontrast enhancement
on the plantar surface of the foot about the plantar fascia which is
most notable in the subcutaneous fatty tissues. A small focal tear
in the plantar fascia is seen at the level of the proximal
metaphysis of the second metatarsal as demonstrated on images 22-24
of series 5. No mass or focal fluid collection is identified.
IMPRESSION: Small focal rupture in the plantar fascia at the level the proximal
metaphysis of the second metatarsal with surrounding edema and
enhancement consistent with inflammatory change. The examination is
otherwise negative.

## 2019-12-26 ENCOUNTER — Other Ambulatory Visit: Payer: Self-pay

## 2019-12-26 ENCOUNTER — Encounter (HOSPITAL_COMMUNITY): Payer: Self-pay

## 2019-12-26 DIAGNOSIS — G5602 Carpal tunnel syndrome, left upper limb: Secondary | ICD-10-CM | POA: Insufficient documentation

## 2019-12-26 DIAGNOSIS — X501XXA Overexertion from prolonged static or awkward postures, initial encounter: Secondary | ICD-10-CM | POA: Insufficient documentation

## 2019-12-26 DIAGNOSIS — F17200 Nicotine dependence, unspecified, uncomplicated: Secondary | ICD-10-CM | POA: Insufficient documentation

## 2019-12-26 NOTE — ED Triage Notes (Signed)
Pt sts left arm numbness in hand shooting up to elbow for at least 2 weeks. Pt began googling sx and became anxious.

## 2019-12-27 ENCOUNTER — Emergency Department (HOSPITAL_COMMUNITY)
Admission: EM | Admit: 2019-12-27 | Discharge: 2019-12-27 | Disposition: A | Discharge status: Home / Self Care | Payer: Self-pay | Attending: Emergency Medicine | Admitting: Emergency Medicine

## 2019-12-27 DIAGNOSIS — G5602 Carpal tunnel syndrome, left upper limb: Secondary | ICD-10-CM

## 2019-12-27 NOTE — Discharge Instructions (Addendum)
Thank you for allowing me to care for you today in the Emergency Department.   Wear the brace on your wrist, particularly when you are driving and at night, to help with your symptoms.  Take 650 mg of Tylenol or 600 mg of ibuprofen with food every 6 hours for pain.  You can alternate between these 2 medications every 3 hours if your pain returns.  For instance, you can take Tylenol at noon, followed by a dose of ibuprofen at 3, followed by second dose of Tylenol and 6.  You can also apply ice packs for 15 to 20 minutes as frequently as needed to help with symptoms.  Follow-up with neurology if your symptoms do not improve after several weeks despite the regimen above.  Return to the emergency department if you start having a new numbness in other areas, if the numbness becomes persistent, if you have any weakness, begin regularly dropping things at your hands, develop slurred speech, feeling off balance and falling, facial droop, or other new, concerning symptoms.

## 2019-12-27 NOTE — ED Notes (Signed)
Pt given water 

## 2019-12-27 NOTE — ED Notes (Signed)
Pt ambulated to BR with steady gait.

## 2019-12-27 NOTE — ED Provider Notes (Signed)
[facility] DEPT Provider Note   CSN: [id number] Arrival date & time: 12/26/19  2229     History Chief Complaint  Patient presents with  . Arm Numbness    Frank Novelo is a 39 y.o. male with a history of anxiety who presents to the emergency department with a chief complaint of numbness and tingling in the left arm.  The patient's been having intermittent numbness and tingling with an achy pain in the left hand and wrist that will intermittently radiate up the arm.  He thinks that symptoms are worse in the second, third, and fourth digit, but he is unsure.  He came in tonight after the symptoms awoke him from sleep.  He is unsure how long the symptoms last when they start.  No known aggravating or alleviating factors.  He tried taking ibuprofen previously for symptoms without improvement.  No weakness, decreased grip strength, slurred speech, facial droop, numbness or paresthesias in other parts of the body, dizziness, ataxia, neck pain, recent injuries to the left arm, swelling or redness, fever, or chills.  He reports that he became worried after googling his symptoms since they have been coming and going for 2 weeks, and came to the ER for further evaluation.  The patient is a long-distance Administrator.  Reports that he typically drives with his left hand on the wheel for 8 to 9 hours at a time.  He reports that he did drive to [place] yesterday.  No recent left arm injuries or surgeries.   The history is provided by the patient. No language interpreter was used.       Past Medical History:  Diagnosis Date  . Anxiety     There are no problems to display for this patient.   History reviewed. No pertinent surgical history.     No family history on file.  Social History   Tobacco Use  . Smoking status: Current Every Day Smoker  . Smokeless tobacco: Never Used  Substance Use Topics  . Alcohol use: Never  . Drug use: Never    Home  Medications Prior to Admission medications   Medication Sig Start Date End Date Taking? Authorizing Provider  simethicone (MYLICON) 80 MG chewable tablet Chew 80 mg by mouth every 6 (six) hours as needed for flatulence.   Yes [provider]    Allergies    Gadolinium derivatives  Review of Systems   Review of Systems  Constitutional: Negative for appetite change and fever.  Respiratory: Negative for shortness of breath.   Cardiovascular: Negative for chest pain.  Gastrointestinal: Negative for abdominal pain.  Genitourinary: Negative for dysuria.  Musculoskeletal: Negative for back pain, myalgias, neck pain and neck stiffness.  Skin: Negative for rash.  Allergic/Immunologic: Negative for immunocompromised state.  Neurological: Positive for numbness. Negative for dizziness, weakness and headaches.       Paresthesias  Psychiatric/Behavioral: Negative for confusion.    Physical Exam Updated Vital Signs BP (!) 143/83 (BP Location: Left Arm)   Pulse 72   Temp 98.4 F (36.9 C) (Oral)   Resp 17   Ht 6\' 3"  (1.905 m)   Wt 102.1 kg   SpO2 98%   BMI 28.12 kg/m   Physical Exam Vitals and nursing note reviewed.  Constitutional:      Appearance: He is well-developed.  HENT:     Head: Normocephalic.  Eyes:     Conjunctiva/sclera: Conjunctivae normal.  Cardiovascular:     Rate and Rhythm: Normal rate  and regular rhythm.     Heart sounds: No murmur.  Pulmonary:     Effort: Pulmonary effort is normal.  Abdominal:     General: There is no distension.     Palpations: Abdomen is soft.  Musculoskeletal:     Cervical back: Neck supple.     Comments: Full active and passive range of motion of the left wrist, elbow, and shoulder.  Radial pulses are 2+ and symmetric.  Sensation is intact throughout the digits of the left hand, left hand, and left upper arm.  About a 5 strength against resistance of all muscle groups in the bilateral upper extremities.  There is now erythema,  warmth, edema, induration, rash, or fullness to muscular compartments.  Negative Phalen test.  Skin:    General: Skin is warm and dry.  Neurological:     Mental Status: He is alert.  Psychiatric:        Behavior: Behavior normal.     ED Results / Procedures / Treatments   Labs (all labs ordered are listed, but only abnormal results are displayed) Labs Reviewed - No data to display  EKG None  Radiology No results found.  Procedures Procedures (including critical care time)  Medications Ordered in ED Medications - No data to display  ED Course  I have reviewed the triage vital signs and the nursing notes.  Pertinent labs & imaging results that were available during my care of the patient were reviewed by me and considered in my medical decision making (see chart for details).    MDM Rules/Calculators/A&P                      39 year old male with no significant past medical history presenting with intermittent numbness and paresthesias in the left upper arm, predominantly around the left wrist and hand, and forearm for the last 2 weeks.  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  Symptoms seem to be worse at night, he awoke with tingling in the left hand, which prompted his ER visit tonight.  Vital signs are normal.  Symptoms do not seem consistent with cervical radiculopathy.  Doubt upper extremity DVT.  Doubt CVA.  He is having no other neurologic deficits.  Very low suspicion for central neurologic process.  On exam, he is neurovascularly intact throughout.  Given intermittent nature and repetitive use of the left hand and wrist, question carpal tunnel syndrome.  Will place the patient in a wrist splint for comfort and recommended rice therapy.  He will be given a referral if symptoms persist.  He is hemodynamically stable and in no acute distress.  Safe for discharge home with outpatient follow-up as  indicated.  Final Clinical Impression(s) / ED Diagnoses Final diagnoses:  Carpal tunnel syndrome, left    Rx / DC Orders ED Discharge Orders    None       Barkley Boards, PA-C 12/27/19 1002    Rancour, Jeannett Senior, MD 12/27/19 1621

## 2023-11-12 ENCOUNTER — Encounter (HOSPITAL_BASED_OUTPATIENT_CLINIC_OR_DEPARTMENT_OTHER): Payer: Self-pay | Admitting: Emergency Medicine

## 2023-11-12 ENCOUNTER — Other Ambulatory Visit: Payer: Self-pay

## 2023-11-12 ENCOUNTER — Emergency Department (HOSPITAL_BASED_OUTPATIENT_CLINIC_OR_DEPARTMENT_OTHER)
Admission: EM | Admit: 2023-11-12 | Discharge: 2023-11-12 | Disposition: A | Discharge status: Home / Self Care | Payer: Worker's Compensation | Attending: Emergency Medicine | Admitting: Emergency Medicine

## 2023-11-12 DIAGNOSIS — S7011XA Contusion of right thigh, initial encounter: Secondary | ICD-10-CM | POA: Insufficient documentation

## 2023-11-12 DIAGNOSIS — W260XXA Contact with knife, initial encounter: Secondary | ICD-10-CM | POA: Insufficient documentation

## 2023-11-12 DIAGNOSIS — S161XXA Strain of muscle, fascia and tendon at neck level, initial encounter: Secondary | ICD-10-CM | POA: Diagnosis not present

## 2023-11-12 DIAGNOSIS — S5001XA Contusion of right elbow, initial encounter: Secondary | ICD-10-CM | POA: Insufficient documentation

## 2023-11-12 DIAGNOSIS — S199XXA Unspecified injury of neck, initial encounter: Secondary | ICD-10-CM | POA: Diagnosis present

## 2023-11-12 MED ORDER — METHOCARBAMOL 500 MG PO TABS: 500.0000 mg | ORAL_TABLET | Freq: Two times a day (BID) | ORAL | 0 refills | Status: AC

## 2023-11-12 NOTE — ED Triage Notes (Signed)
 Pt reports mechanical fall yesterday. Pt c/o LT side neck pain, LT knee pain, RT proximal LE pain. RT elbow pain.

## 2023-11-12 NOTE — Discharge Instructions (Addendum)
 Please use Tylenol or ibuprofen for pain.  You may use 600 mg ibuprofen every 6 hours or 1000 mg of Tylenol every 6 hours.  You may choose to alternate between the 2.  This would be most effective.  Not to exceed 4 g of Tylenol within 24 hours.  Not to exceed 3200 mg ibuprofen 24 hours.  You can use the muscle relaxant I am prescribing in addition to the above to help with any breakthrough pain.  You can take it up to twice daily.  It is safe to take at night, but I would be cautious taking it during the day as it can cause some drowsiness.  Make sure that you are feeling awake and alert before you get behind the wheel of a car or operate a motor vehicle.  It is not a narcotic pain medication so you are able to take it if it is not making you drowsy and still pilot a vehicle or machinery safely.  You can contact the orthopedic surgeon whose contact information I provided for follow-up if your pain is not significantly improved after 1 to 2 weeks of treatment as above.

## 2023-11-12 NOTE — ED Provider Notes (Signed)
 [place] EMERGENCY DEPARTMENT AT [facility] Provider Note   CSN: [id number] Arrival date & time: 11/12/23  4098     History  Chief Complaint  Patient presents with   Marletta Lor    Jonathan Ortiz is a 43 y.o. male with a past medical history significant for anxiety who presents with concern for left-sided neck pain, right knee pain, right elbow pain after mechanical, nonsyncopal fall yesterday.  Patient reports that he slipped between the cracks on a loading dock and caught himself hard on the right thigh.  He reports that he had a pocket knife in his pocket and feels pain where he pressed into his leg.  He reports that he has been able to walk without difficulty, took nothing for pain prior to arrival, he rates his pain 5/10.  Reports some neck pain with bending the neck.  Denies any numbness, tingling throughout.  He does not take any blood thinners, he did not hit his head.   Fall       Home Medications Prior to Admission medications   Medication Sig Start Date End Date Taking? Authorizing Provider  simethicone (MYLICON) 80 MG chewable tablet Chew 80 mg by mouth every 6 (six) hours as needed for flatulence.    [provider]      Allergies    Gadolinium derivatives    Review of Systems   Review of Systems  All other systems reviewed and are negative.   Physical Exam Updated Vital Signs BP 130/83   Pulse 79   Temp 98.6 F (37 C)   Resp 18   Wt 104.3 kg   SpO2 98%   BMI 28.75 kg/m  Physical Exam Vitals and nursing note reviewed.  Constitutional:      General: He is not in acute distress.    Appearance: Normal appearance.  HENT:     Head: Normocephalic and atraumatic.  Eyes:     General:        Right eye: No discharge.        Left eye: No discharge.  Cardiovascular:     Rate and Rhythm: Normal rate and regular rhythm.     Pulses: Normal pulses.     Heart sounds: No murmur heard.    No friction rub. No gallop.  Pulmonary:     Effort:  Pulmonary effort is normal.     Breath sounds: Normal breath sounds.  Abdominal:     General: Bowel sounds are normal.     Palpations: Abdomen is soft.  Musculoskeletal:     Comments: Mild tenderness to palpation without step-off, deformity of right upper thigh, normal range of motion, no tenderness to palpation of right knee, left knee.  Patient can ambulate without difficulty, normal gait.  He has some mild point tenderness on the right elbow but normal range of motion to flexion, extension at the right elbow.  No step-off, deformity.  No tenderness to palpation of bilateral shoulders, hands, wrists.  He additionally has some mild tenderness to palpation of the left sternocleidomastoid, no midline cervical spinal tenderness.  Normal range of motion to flexion, extension, lateral rotation of neck.  Skin:    General: Skin is warm and dry.     Capillary Refill: Capillary refill takes less than 2 seconds.  Neurological:     Mental Status: He is alert and oriented to person, place, and time.  Psychiatric:        Mood and Affect: Mood normal.  Behavior: Behavior normal.     ED Results / Procedures / Treatments   Labs (all labs ordered are listed, but only abnormal results are displayed) Labs Reviewed - No data to display  EKG None  Radiology No results found.  Procedures Procedures    Medications Ordered in ED Medications - No data to display  ED Course/ Medical Decision Making/ A&P                                 Medical Decision Making  This an overall well-appearing 44 year old male who presents with concern for fall, left neck pain, right elbow, right Upper thigh pain after mechanical, nonsyncopal fall yesterday.  My emergent differential diagnosis includes acute fracture, dislocation, contusion, other musculoskeletal injury, laceration, versus other.  Patient denies any head injury, no evidence of head trauma.  His physical exam is reassuring, he is neurovascular  intact throughout.  He has evidence of contusion of the right elbow, right thigh, he has normal range of motion and normal strength throughout, all I do not think there is any clinical indication for any advanced imaging at this time.  He has not tenderness additionally of the left sternocleidomastoid muscles, reproducible on exam, no midline spinal tenderness.  Consistent with SCM strain.  Encouraged ibuprofen, Tylenol, muscle relaxant, orthopedic follow-up as needed. Final Clinical Impression(s) / ED Diagnoses Final diagnoses:  Strain of sternocleidomastoid muscle, initial encounter  Contusion of right elbow, initial encounter  Contusion of right thigh, initial encounter    Rx / DC Orders ED Discharge Orders     None         [place] 11/12/23 1136    Jacalyn Lefevre, MD 11/12/23 1150
